# Patient Record
Sex: Male | Born: 1985 | Race: Black or African American | Hispanic: No | Marital: Single | Smoking: Never smoker
Health system: Southern US, Community
[De-identification: ages and names within clinical notes are randomized; demographics above are authoritative.]

---

## 2008-01-10 ENCOUNTER — Emergency Department (HOSPITAL_COMMUNITY): Admission: EM | Admit: 2008-01-10 | Discharge: 2008-01-10 | Payer: Self-pay | Admitting: Emergency Medicine

## 2010-12-05 ENCOUNTER — Inpatient Hospital Stay (INDEPENDENT_AMBULATORY_CARE_PROVIDER_SITE_OTHER)
Admission: RE | Admit: 2010-12-05 | Discharge: 2010-12-05 | Disposition: A | Discharge status: Home / Self Care | Payer: Self-pay | Source: Ambulatory Visit | Attending: Family Medicine | Admitting: Family Medicine

## 2010-12-05 DIAGNOSIS — A64 Unspecified sexually transmitted disease: Secondary | ICD-10-CM

## 2010-12-05 DIAGNOSIS — A599 Trichomoniasis, unspecified: Secondary | ICD-10-CM

## 2016-02-26 ENCOUNTER — Emergency Department (HOSPITAL_COMMUNITY): Payer: Self-pay

## 2016-02-26 ENCOUNTER — Encounter (HOSPITAL_COMMUNITY): Payer: Self-pay | Admitting: Emergency Medicine

## 2016-02-26 ENCOUNTER — Emergency Department (HOSPITAL_COMMUNITY)
Admission: EM | Admit: 2016-02-26 | Discharge: 2016-02-26 | Disposition: A | Discharge status: Home / Self Care | Payer: Self-pay | Attending: Emergency Medicine | Admitting: Emergency Medicine

## 2016-02-26 DIAGNOSIS — R269 Unspecified abnormalities of gait and mobility: Secondary | ICD-10-CM | POA: Insufficient documentation

## 2016-02-26 DIAGNOSIS — M7989 Other specified soft tissue disorders: Secondary | ICD-10-CM | POA: Insufficient documentation

## 2016-02-26 DIAGNOSIS — Y92009 Unspecified place in unspecified non-institutional (private) residence as the place of occurrence of the external cause: Secondary | ICD-10-CM | POA: Insufficient documentation

## 2016-02-26 DIAGNOSIS — Y939 Activity, unspecified: Secondary | ICD-10-CM | POA: Insufficient documentation

## 2016-02-26 DIAGNOSIS — W2209XA Striking against other stationary object, initial encounter: Secondary | ICD-10-CM | POA: Insufficient documentation

## 2016-02-26 DIAGNOSIS — M79672 Pain in left foot: Secondary | ICD-10-CM | POA: Insufficient documentation

## 2016-02-26 DIAGNOSIS — Y999 Unspecified external cause status: Secondary | ICD-10-CM | POA: Insufficient documentation

## 2016-02-26 MED ORDER — IBUPROFEN 800 MG PO TABS
800.0000 mg | ORAL_TABLET | Freq: Once | ORAL | Status: AC
  Administered 2016-02-26: 800 mg via ORAL
  Filled 2016-02-26: qty 1

## 2016-02-26 NOTE — Discharge Instructions (Signed)
Your x-ray does not show serious injury of the foot. Take motrin and tylenol for pain. Keep elevated at rest, use compression dressing, and ice as needed. This may take a week or so to fully get better  Musculoskeletal Pain Musculoskeletal pain is muscle and boney aches and pains. These pains can occur in any part of the body. Your caregiver may treat you without knowing the cause of the pain. They may treat you if blood or urine tests, X-rays, and other tests were normal.  CAUSES There is often not a definite cause or reason for these pains. These pains may be caused by a type of germ (virus). The discomfort may also come from overuse. Overuse includes working out too hard when your body is not fit. Boney aches also come from weather changes. Bone is sensitive to atmospheric pressure changes. HOME CARE INSTRUCTIONS   Ask when your test results will be ready. Make sure you get your test results.  Only take over-the-counter or prescription medicines for pain, discomfort, or fever as directed by your caregiver. If you were given medications for your condition, do not drive, operate machinery or power tools, or sign legal documents for 24 hours. Do not drink alcohol. Do not take sleeping pills or other medications that may interfere with treatment.  Continue all activities unless the activities cause more pain. When the pain lessens, slowly resume normal activities. Gradually increase the intensity and duration of the activities or exercise.  During periods of severe pain, bed rest may be helpful. Lay or sit in any position that is comfortable.  Putting ice on the injured area.  Put ice in a bag.  Place a towel between your skin and the bag.  Leave the ice on for 15 to 20 minutes, 3 to 4 times a day.  Follow up with your caregiver for continued problems and no reason can be found for the pain. If the pain becomes worse or does not go away, it may be necessary to repeat tests or do additional  testing. Your caregiver may need to look further for a possible cause. SEEK IMMEDIATE MEDICAL CARE IF:  You have pain that is getting worse and is not relieved by medications.  You develop chest pain that is associated with shortness or breath, sweating, feeling sick to your stomach (nauseous), or throw up (vomit).  Your pain becomes localized to the abdomen.  You develop any new symptoms that seem different or that concern you. MAKE SURE YOU:   Understand these instructions.  Will watch your condition.  Will get help right away if you are not doing well or get worse.   This information is not intended to replace advice given to you by your health care provider. Make sure you discuss any questions you have with your health care provider.   Document Released: 09/24/2005 Document Revised: 12/17/2011 Document Reviewed: 05/29/2013 Elsevier Interactive Patient Education [employer]2016 [employer]

## 2016-02-26 NOTE — ED Notes (Signed)
Pt states that he hit his L foot two days ago on his daughter's bed but it is still hurting. Alert and oriented.

## 2016-02-26 NOTE — ED Provider Notes (Signed)
CSN: [id number]     Arrival date & time 02/26/16  2002 History   First MD Initiated Contact with Patient 02/26/16 2218     Chief Complaint  Patient presents with  . Foot Injury     (Consider location/radiation/quality/duration/timing/severity/associated sxs/prior Treatment) HPI 30 year old male who presents with left foot pain. he is otherwise healthy. States that 2 days ago yesterday he kicked the bedpost of his daughter's bed. He has noted some swelling over the top of his foot and some difficulty ambulating due to pain. No numbness or weakness. He did not fall or have other injuries. History reviewed. No pertinent past medical history. No past surgical history on file. History reviewed. No pertinent family history. Social History  Substance Use Topics  . Smoking status: None  . Smokeless tobacco: None  . Alcohol Use: None    Review of Systems  Musculoskeletal: Positive for gait problem (due to left foot pain).  Skin: Negative for wound.  Neurological: Negative for weakness and numbness.  Hematological: Does not bruise/bleed easily.  All other systems reviewed and are negative.     Allergies  Review of patient's allergies indicates no known allergies.  Home Medications   Prior to Admission medications   Not on File   BP 140/81 mmHg  Pulse 82  Temp(Src) 98.4 F (36.9 C) (Oral)  Resp 18  SpO2 99% Physical Exam Physical Exam  Nursing note and vitals reviewed. Constitutional: Well developed, well nourished, non-toxic, and in no acute distress Head: Normocephalic and atraumatic.  Mouth/Throat: Oropharynx is clear and moist.  Neck: Normal range of motion. Neck supple.  Cardiovascular: +2 dp pulse of LLE Pulmonary/Chest: Effort normal Abdominal: Soft. There is no tenderness. There is no rebound and no guarding.  Musculoskeletal: Normal range of motion. mild soft tissue swelling over dorsum of left  foot at the base of the 3-5th digits. Full range of motion of toes  and ankle Neurological: Alert, no facial droop, fluent speech, moves all extremities symmetrically, sensation to light touch in tact Skin: Skin is warm and dry.  Psychiatric: Cooperative  ED Course  Procedures (including critical care time) Labs Review Labs Reviewed - No data to display  Imaging Review Dg Foot Complete Left  02/26/2016  CLINICAL DATA:  Left foot pain after injury. Struck foot on bed post at home earlier today. Pain and swelling most prominent about the third metatarsal phalangeal joint. Initial encounter. EXAM: LEFT FOOT - COMPLETE 3+ VIEW COMPARISON:  None. FINDINGS: There is no evidence of fracture or dislocation. There is no evidence of arthropathy or other focal bone abnormality. Mild dorsal soft tissue edema about the metatarsals. IMPRESSION: Soft tissue edema.  No evidence of acute fracture or subluxation. Electronically Signed   By: Rubye OaksMelanie  Ehinger M.D.   On: 02/26/2016 21:15   I have personally reviewed and evaluated these images and lab results as part of my medical decision-making.   EKG Interpretation None      MDM   Final diagnoses:  Left foot pain    x-ray does not show fracture or other serious injury.  Extremity is neurovascularly intact. With some soft tissue swelling over the dorsum of the foot. Discussed supportive care instructions for home, including RICE. Strict return and follow-up instructions reviewed. He expressed understanding of all discharge instructions and felt comfortable with the plan of care.     Lavera Guiseana Duo Elene Downum, MD 02/26/16 2233

## 2017-09-15 IMAGING — CR DG FOOT COMPLETE 3+V*L*
3 series · 3 of 3 positions shown · non-contrast
Comparison: None.

CLINICAL DATA: Left foot pain after injury. Struck foot on bed post
at home earlier today. Pain and swelling most prominent about the
third metatarsal phalangeal joint. Initial encounter.

EXAM:
LEFT FOOT - COMPLETE 3+ VIEW

[x foot ap left]
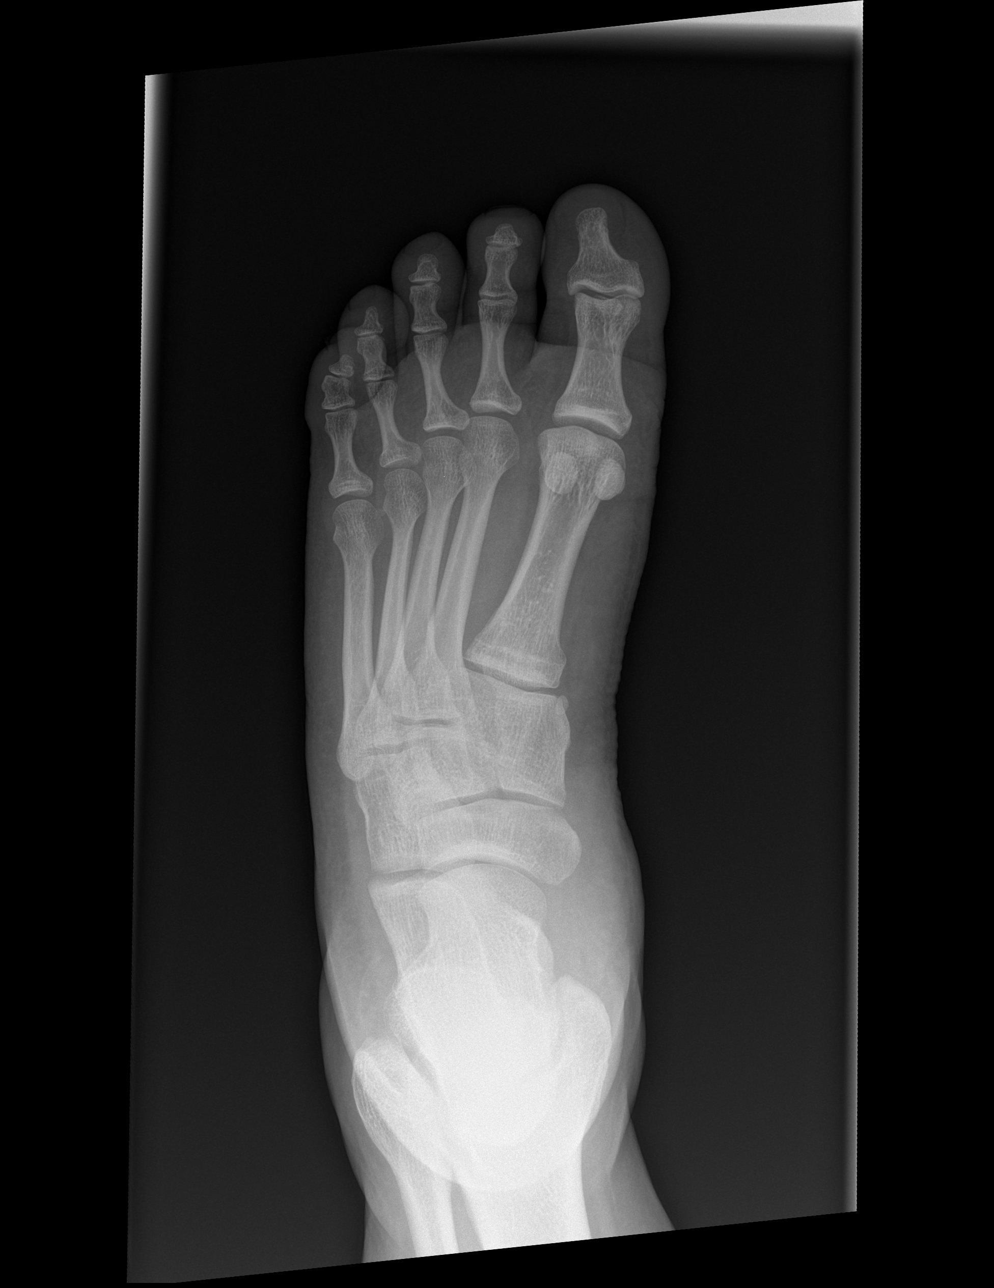

[x foot obl left]
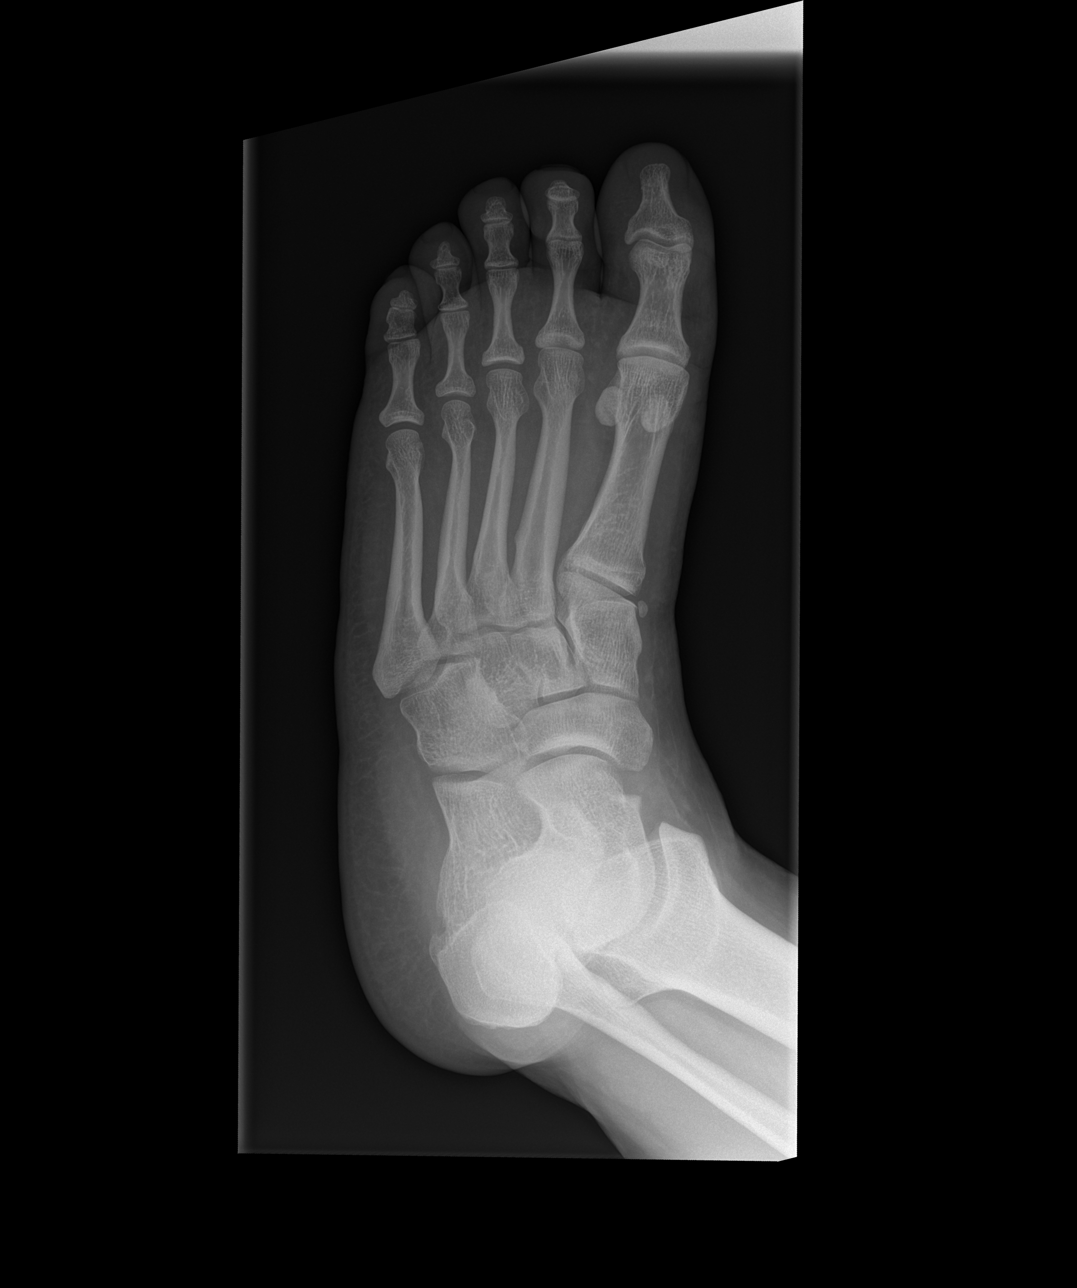

[x foot lat left]
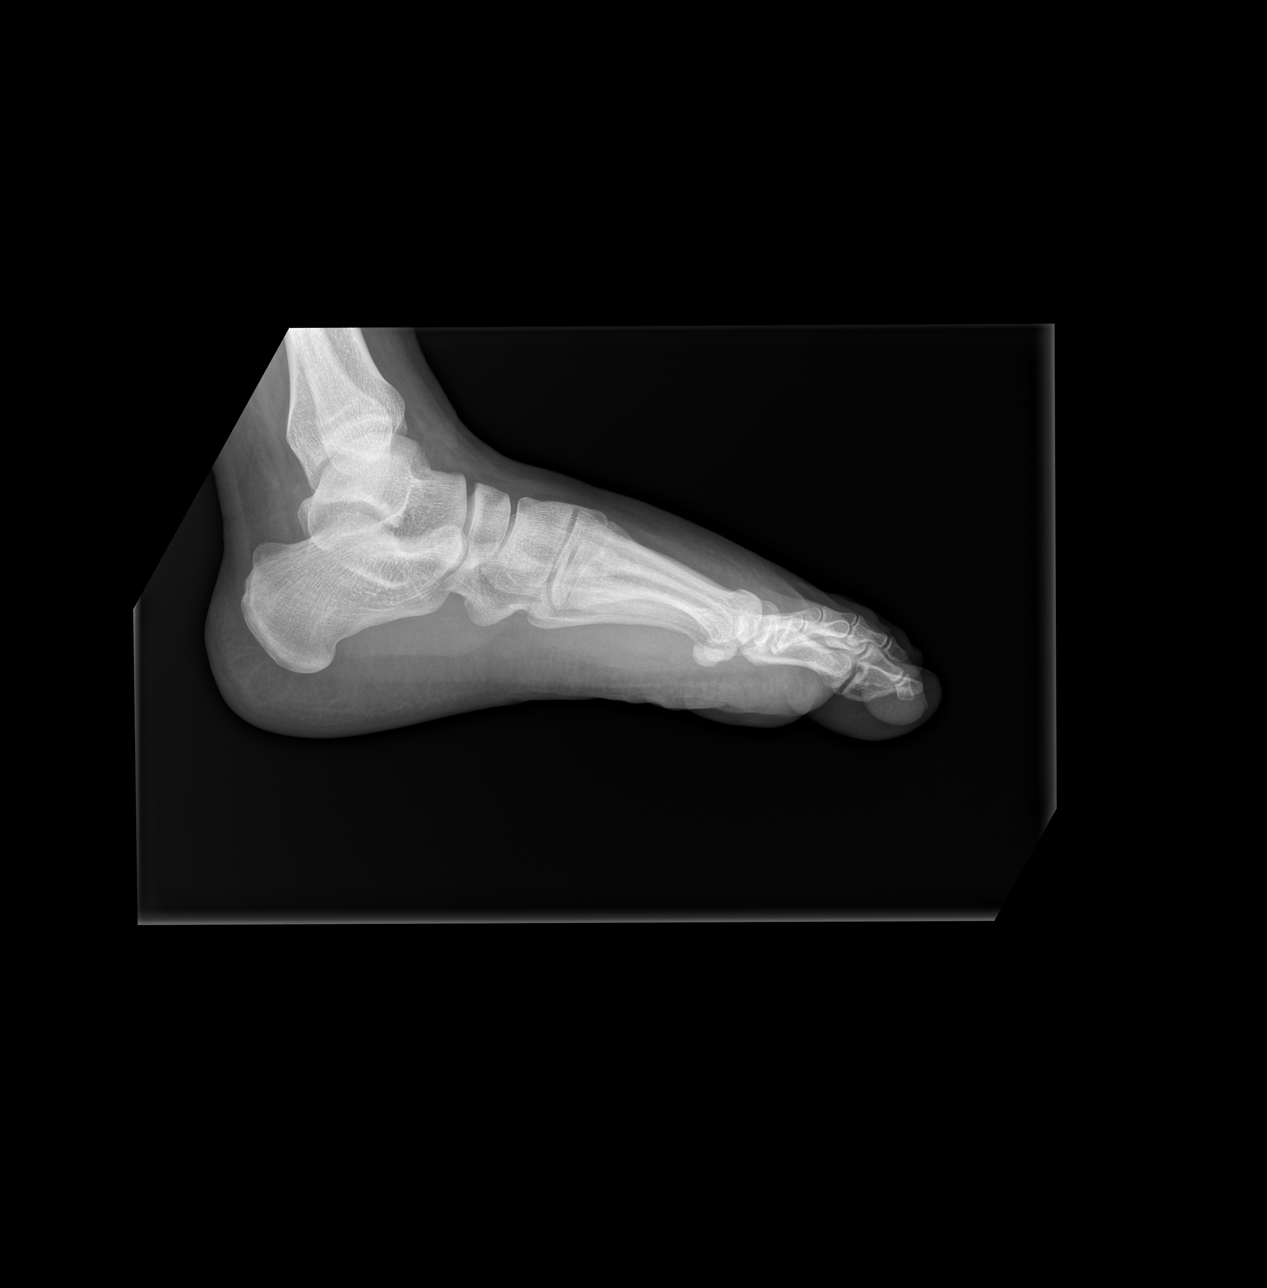

[3 of 3 positions shown; findings below may reference images not displayed]

FINDINGS: There is no evidence of fracture or dislocation. There is no
evidence of arthropathy or other focal bone abnormality. Mild dorsal
soft tissue edema about the metatarsals.
IMPRESSION: Soft tissue edema.  No evidence of acute fracture or subluxation.

## 2019-05-11 ENCOUNTER — Emergency Department (HOSPITAL_COMMUNITY): Admission: EM | Admit: 2019-05-11 | Discharge: 2019-05-11 | Discharge status: Absent without leave | Payer: Self-pay

## 2023-11-16 ENCOUNTER — Emergency Department (HOSPITAL_COMMUNITY): Payer: Self-pay

## 2023-11-16 ENCOUNTER — Emergency Department (HOSPITAL_COMMUNITY)
Admission: EM | Admit: 2023-11-16 | Discharge: 2023-11-16 | Disposition: A | Discharge status: Home / Self Care | Payer: Self-pay | Attending: Emergency Medicine | Admitting: Emergency Medicine

## 2023-11-16 ENCOUNTER — Other Ambulatory Visit: Payer: Self-pay

## 2023-11-16 ENCOUNTER — Encounter (HOSPITAL_COMMUNITY): Payer: Self-pay | Admitting: Emergency Medicine

## 2023-11-16 DIAGNOSIS — Y907 Blood alcohol level of 200-239 mg/100 ml: Secondary | ICD-10-CM | POA: Insufficient documentation

## 2023-11-16 DIAGNOSIS — R590 Localized enlarged lymph nodes: Secondary | ICD-10-CM | POA: Insufficient documentation

## 2023-11-16 DIAGNOSIS — R591 Generalized enlarged lymph nodes: Secondary | ICD-10-CM

## 2023-11-16 DIAGNOSIS — T40711A Poisoning by cannabis, accidental (unintentional), initial encounter: Secondary | ICD-10-CM | POA: Insufficient documentation

## 2023-11-16 DIAGNOSIS — F10920 Alcohol use, unspecified with intoxication, uncomplicated: Secondary | ICD-10-CM

## 2023-11-16 DIAGNOSIS — F10129 Alcohol abuse with intoxication, unspecified: Secondary | ICD-10-CM | POA: Insufficient documentation

## 2023-11-16 DIAGNOSIS — T50901A Poisoning by unspecified drugs, medicaments and biological substances, accidental (unintentional), initial encounter: Secondary | ICD-10-CM

## 2023-11-16 LAB — CBC
HCT: 50.7 % (ref 39.0–52.0)
Hemoglobin: 16.5 g/dL (ref 13.0–17.0)
MCH: 30.4 pg (ref 26.0–34.0)
MCHC: 32.5 g/dL (ref 30.0–36.0)
MCV: 93.5 fL (ref 80.0–100.0)
Platelets: 217 10*3/uL (ref 150–400)
RBC: 5.42 MIL/uL (ref 4.22–5.81)
RDW: 14.3 % (ref 11.5–15.5)
WBC: 10.7 10*3/uL — ABNORMAL HIGH (ref 4.0–10.5)
nRBC: 0 % (ref 0.0–0.2)

## 2023-11-16 LAB — ETHANOL: Alcohol, Ethyl (B): 209 mg/dL — ABNORMAL HIGH (ref <10)

## 2023-11-16 LAB — COMPREHENSIVE METABOLIC PANEL
ALT: 35 U/L (ref 0–44)
AST: 30 U/L (ref 15–41)
Albumin: 4.6 g/dL (ref 3.5–5.0)
Alkaline Phosphatase: 50 U/L (ref 38–126)
Anion gap: 14 (ref 5–15)
BUN: 13 mg/dL (ref 6–20)
CO2: 25 mmol/L (ref 22–32)
Calcium: 9.3 mg/dL (ref 8.9–10.3)
Chloride: 103 mmol/L (ref 98–111)
Creatinine, Ser: 1.07 mg/dL (ref 0.61–1.24)
GFR, Estimated: >60 mL/min (ref >60)
Glucose, Bld: 114 mg/dL — ABNORMAL HIGH (ref 70–99)
Potassium: 3.6 mmol/L (ref 3.5–5.1)
Sodium: 142 mmol/L (ref 135–145)
Total Bilirubin: 0.4 mg/dL (ref 0.0–1.2)
Total Protein: 8.7 g/dL — ABNORMAL HIGH (ref 6.5–8.1)

## 2023-11-16 LAB — ACETAMINOPHEN LEVEL: Acetaminophen (Tylenol), Serum: <10 ug/mL — ABNORMAL LOW (ref 10–30)

## 2023-11-16 LAB — CBG MONITORING, ED: Glucose-Capillary: 123 mg/dL — ABNORMAL HIGH (ref 70–99)

## 2023-11-16 LAB — SALICYLATE LEVEL: Salicylate Lvl: <7 mg/dL — ABNORMAL LOW (ref 7.0–30.0)

## 2023-11-16 MED ORDER — NALOXONE HCL 2 MG/2ML IJ SOSY
PREFILLED_SYRINGE | INTRAMUSCULAR | Status: AC
  Administered 2023-11-16: 2 mg
  Filled 2023-11-16: qty 2

## 2023-11-16 MED ORDER — ONDANSETRON 8 MG PO TBDP: 8.0000 mg | ORAL_TABLET | Freq: Once | ORAL | Status: DC

## 2023-11-16 MED ORDER — NALOXONE HCL 4 MG/0.1ML NA LIQD: 2.0000 mg | Freq: Once | NASAL | Status: DC

## 2023-11-16 MED ORDER — ONDANSETRON HCL 4 MG/2ML IJ SOLN
INTRAMUSCULAR | Status: AC
  Filled 2023-11-16: qty 2

## 2023-11-16 MED ORDER — ONDANSETRON 8 MG PO TBDP
ORAL_TABLET | ORAL | Status: AC
  Filled 2023-11-16: qty 1

## 2023-11-16 NOTE — ED Notes (Signed)
 Pt. Ambulates independently to the bathroom and back to the room. Gingerale provided for PO challenge.

## 2023-11-16 NOTE — ED Triage Notes (Addendum)
 Pt in via [facility] for reported overdose. EMS states initial callout was for pinpoint pupils and apnea, and that bystanders reportedly gave IN Narcan  (vial still full on EMS arrival) and pt then awoke. Pt endorses heavy ETOH use and +THC use tonight, denies any other substances. Ambulatory into triage, a&ox4, pupils 3 round and reactive. EMS states emesis and fall down 3 stairs PTA. Pt denies any injuries or pain, does not remember falling

## 2023-11-16 NOTE — ED Provider Notes (Signed)
 [place] EM[facility] DEPARTMENT AT [facility] Provider Note   CSN: [id number] Arrival date & time: 11/16/23  0154     History  Chief Complaint  Patient presents with   Drug Overdose   Alcohol Intoxication    Chris Thompson is a 38 y.o. male.  The history is provided by the EMS personnel, a friend and the patient.  Drug Overdose This is a new problem. The problem occurs constantly. The problem has been resolved. Pertinent negatives include no chest pain, no abdominal pain, no headaches and no shortness of breath. Nothing aggravates the symptoms. Nothing relieves the symptoms. Treatments tried: IN narcan . The treatment provided significant relief.  Alcohol Intoxication The problem occurs constantly. The problem has not changed since onset.Pertinent negatives include no chest pain, no abdominal pain, no headaches and no shortness of breath. Nothing aggravates the symptoms. Nothing relieves the symptoms.  Patient was BIB EMS for overdose. Patient was reportedly out and drinking ETOH heavily and smoking marijuana and fell down.  He was not responding and reportedly had pinpoint pupils and apnea. Bystanders administered IN narcan  with awakening.  Patient denies using opioids.     History reviewed. No pertinent past medical history.   Home Medications Prior to Admission medications   Not on File      Allergies    Patient has no known allergies.    Review of Systems   Review of Systems  Constitutional:  Negative for fever.  Respiratory:  Negative for shortness of breath.   Cardiovascular:  Negative for chest pain.  Gastrointestinal:  Positive for vomiting. Negative for abdominal pain.  Neurological:  Negative for headaches.    Physical Exam Updated Vital Signs BP (!) 157/123   Pulse (!) 105   Temp 98.3 F (36.8 C) (Oral)   Resp 19   Ht 5' 9 (1.753 m)   Wt (!) 147.4 kg   SpO2 99%   BMI 47.99 kg/m  Physical Exam Vitals and nursing note reviewed. Exam  conducted with a chaperone present.  Constitutional:      General: He is not in acute distress.    Appearance: Normal appearance. He is well-developed. He is not diaphoretic.  HENT:     Head: Normocephalic and atraumatic.     Nose: Nose normal.  Eyes:     Conjunctiva/sclera: Conjunctivae normal.     Pupils: Pupils are equal, round, and reactive to light.  Cardiovascular:     Rate and Rhythm: Normal rate and regular rhythm.     Pulses: Normal pulses.     Heart sounds: Normal heart sounds.     Comments: Clothing covered in vomit  Pulmonary:     Effort: Pulmonary effort is normal.     Breath sounds: Normal breath sounds. No wheezing or rales.  Abdominal:     General: Bowel sounds are normal.     Palpations: Abdomen is soft.     Tenderness: There is no abdominal tenderness. There is no guarding or rebound.  Musculoskeletal:        General: Normal range of motion.     Cervical back: Normal range of motion and neck supple. No rigidity or tenderness.  Lymphadenopathy:     Cervical: No cervical adenopathy.  Skin:    General: Skin is warm and dry.     Capillary Refill: Capillary refill takes less than 2 seconds.  Neurological:     General: No focal deficit present.     Mental Status: He is alert.  Deep Tendon Reflexes: Reflexes normal.  Psychiatric:        Mood and Affect: Mood normal.     ED Results / Procedures / Treatments   Labs (all labs ordered are listed, but only abnormal results are displayed) Results for orders placed or performed during the hospital encounter of 11/16/23  cbc   Collection Time: 11/16/23  2:14 AM  Result Value Ref Range   WBC 10.7 (H) 4.0 - 10.5 K/uL   RBC 5.42 4.22 - 5.81 MIL/uL   Hemoglobin 16.5 13.0 - 17.0 g/dL   HCT 49.2 60.9 - 47.9 %   MCV 93.5 80.0 - 100.0 fL   MCH 30.4 26.0 - 34.0 pg   MCHC 32.5 30.0 - 36.0 g/dL   RDW 85.6 88.4 - 84.4 %   Platelets 217 150 - 400 K/uL   nRBC 0.0 0.0 - 0.2 %  CBG monitoring, ED   Collection Time:  11/16/23  2:48 AM  Result Value Ref Range   Glucose-Capillary 123 (H) 70 - 99 mg/dL   No results found.  EKG EKG Interpretation Date/Time:  Saturday November 16 2023 02:59:28 EST Ventricular Rate:  116 PR Interval:  155 QRS Duration:  107 QT Interval:  320 QTC Calculation: 445 R Axis:   215  Text Interpretation: Sinus tachycardia Baseline wander in lead(s) V5 Confirmed by Nettie, Alzena Gerber ([id number]) on 11/16/2023 3:17:20 AM  Radiology No results found.  Procedures Procedures    Medications Ordered in ED Medications  ondansetron  (ZOFRAN -ODT) disintegrating tablet 8 mg (has no administration in time range)  naloxone  (NARCAN ) nasal spray 4 mg/0.1 mL (has no administration in time range)  ondansetron  (ZOFRAN -ODT) 8 MG disintegrating tablet (has no administration in time range)  ondansetron  (ZOFRAN ) 4 MG/2ML injection (has no administration in time range)  naloxone  (NARCAN ) 2 MG/2ML injection (2 mg  Given 11/16/23 0307)    ED Course/ Medical Decision Making/ A&P                                 Medical Decision Making Amount and/or Complexity of Data Reviewed Independent Historian: EMS    Details: See above  External Data Reviewed: notes.    Details: Previous notes reviewed  Labs: ordered.    Details: White count 10.7, normal hemoglobin 16.5, normal platelet counts  Radiology: ordered and independent interpretation performed.    Details: No ICH by me on CT ECG/medicine tests: ordered and independent interpretation performed. Decision-making details documented in ED Course.  Risk Prescription drug management. Risk Details: Became somnolent in the ED, additional narcan  ordered but patient waved off staff and refused IN narcan . Patient is denying opioid use but EDP explained patient was given narcan  with effect in the community.  Marijuana and or drink may have been contaminated with an opioid.  Patient is being ruled out for intracranial trauma from fall.  Observed in the ED.   Incidental finding of lymphadenopathy.  No palpable. Have informed patient verbally and in writing on discharge of this finding and need for follow up with both ENT and PMD.  Patient verbalizes understanding.  Patient is now clinically sober.  PO challenged and ambulated in the ED. Stable for discharge.     or why not admission, treatments were needed:1} Final Clinical Impression(s) / ED Diagnoses Final diagnoses:  None    I have reviewed the triage vital signs and the nursing notes. Pertinent labs & imaging results that were available during  my care of the patient were reviewed by me and considered in my medical decision making (see chart for details). After history, exam, and medical workup I feel the patient has been appropriately medically screened and is safe for discharge home. Pertinent diagnoses were discussed with the patient. Patient was given return precautions.    Rx / DC Orders ED Discharge Orders     None         Casson Catena, MD 11/16/23 [id number]

## 2023-11-16 NOTE — ED Notes (Signed)
 Patient verbalizes understanding of discharge instructions. Opportunity for questioning and answers were provided. Armband removed by staff, pt discharged from ED. Pt has a taxi en route to take him home. Ambulated out to lobby
